# Patient Record
Sex: Female | Born: 1995 | Race: Black or African American | Hispanic: No | Marital: Single | Smoking: Never smoker
Health system: Southern US, Community
[De-identification: ages and names within clinical notes are randomized; demographics above are authoritative.]

## PROBLEM LIST (undated history)

## (undated) ENCOUNTER — Ambulatory Visit (HOSPITAL_COMMUNITY): Admission: EM | Payer: Self-pay

## (undated) DIAGNOSIS — M93003 Unspecified slipped upper femoral epiphysis (nontraumatic), unspecified hip: Secondary | ICD-10-CM

## (undated) DIAGNOSIS — M25562 Pain in left knee: Secondary | ICD-10-CM

## (undated) DIAGNOSIS — S060X0A Concussion without loss of consciousness, initial encounter: Principal | ICD-10-CM

## (undated) HISTORY — DX: Pain in left knee: M25.562

## (undated) HISTORY — PX: OTHER SURGICAL HISTORY: SHX169

## (undated) HISTORY — DX: Unspecified slipped upper femoral epiphysis (nontraumatic), unspecified hip: M93.003

## (undated) HISTORY — DX: Concussion without loss of consciousness, initial encounter: S06.0X0A

---
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
## 2019-10-16 ENCOUNTER — Encounter (HOSPITAL_COMMUNITY): Payer: Self-pay | Admitting: Emergency Medicine

## 2019-10-16 DIAGNOSIS — J020 Streptococcal pharyngitis: Secondary | ICD-10-CM | POA: Insufficient documentation

## 2019-10-16 LAB — GROUP A STREP BY PCR: Group A Strep by PCR: DETECTED — AB

## 2019-10-16 MED ORDER — ACETAMINOPHEN 325 MG PO TABS
650.0000 mg | ORAL_TABLET | Freq: Once | ORAL | Status: AC | PRN
  Administered 2019-10-16: 650 mg via ORAL
  Filled 2019-10-16: qty 2

## 2019-10-16 NOTE — ED Triage Notes (Signed)
Patient c/o sore throat x 2 days. 

## 2019-10-17 ENCOUNTER — Emergency Department (HOSPITAL_COMMUNITY)
Admission: EM | Admit: 2019-10-17 | Discharge: 2019-10-17 | Disposition: A | Discharge status: Home / Self Care | Payer: Self-pay | Attending: Emergency Medicine | Admitting: Emergency Medicine

## 2019-10-17 DIAGNOSIS — J020 Streptococcal pharyngitis: Secondary | ICD-10-CM

## 2019-10-17 MED ORDER — LIDOCAINE VISCOUS HCL 2 % MT SOLN: 15.0000 mL | OROMUCOSAL | 0 refills | Status: AC | PRN

## 2019-10-17 MED ORDER — LIDOCAINE VISCOUS HCL 2 % MT SOLN
15.0000 mL | Freq: Once | OROMUCOSAL | Status: AC
  Administered 2019-10-17: 15 mL via OROMUCOSAL
  Filled 2019-10-17: qty 15

## 2019-10-17 MED ORDER — PENICILLIN G BENZATHINE 1200000 UNIT/2ML IM SUSP
1.2000 10*6.[IU] | Freq: Once | INTRAMUSCULAR | Status: AC
  Administered 2019-10-17: 1.2 10*6.[IU] via INTRAMUSCULAR
  Filled 2019-10-17: qty 2

## 2019-10-17 NOTE — Discharge Instructions (Signed)
Your test for strep throat today was positive.  We have treated you today for this in full. You can continue the viscous lidocaine as needed for discomfort.  Popsicles, warm tea, etc. May help as well. Return here for new concerns.

## 2019-10-17 NOTE — ED Provider Notes (Signed)
[facility]-EMERGENCY DEPT Provider Note   CSN: [id number] Arrival date & time: 10/16/19  2038     History Chief Complaint  Patient presents with  . Sore Throat    Gabriella Klein is a Gabriella Klein.  The history is provided by the patient and medical records.  Sore Throat    Gabri[age] y.o. F presenting to the ED for sore throat x2 days.  States it is extremely painful when trying to eat and drink but she is able to swallow without difficulty.  She has had some low-grade fevers.  She denies any sick contacts or noted Covid exposures.  States she feels like there is "residue" on the back of her tongue.  Tylenol given in triage, no other intervention tried.  Past Medical History:  Diagnosis Date  . Concussion without loss of consciousness 01/06/2012   Kicked in head cheerleading  . Knee pain, left 08/2011   Referred to Dr.  Charlett Blake b/o hx of SCFE on other side   . 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 Viral pharyngitis 05/03/2015  . Tinea corporis 09/20/2014  . Epigastric pain 07/10/2014  . Other seasonal allergic rhinitis 01/04/2014  . Need for prophylactic vaccination and inoculation against influenza 01/04/2014  . Immunization due 01/04/2014  . External hemorrhoid 11/30/2013  . Irregular periods/menstrual cycles 11/30/2013  . Furuncle of ear canal 05/Gabriella/2015  . 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 Left hip pain 09/16/2012  . Concussion without loss of consciousness 01/06/2012  . 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 SCFE    . SLIPPED CAPITAL FEMORAL EPIPHYSIS PINNING  2011   left     OB History   No obstetric history on file.     No family history on file.  Social History   Tobacco Use  . Smoking status: Never Smoker  Substance Use Topics  . Alcohol use:  Not on file  . Drug use: Not on file    Home Medications Prior to Admission medications   Medication Sig Start Date End Date Taking? Authorizing Provider  dicyclomine (BENTYL) 20 MG tablet Take 1 tablet (20 mg total) by mouth 4 (four) times daily -  before meals and at bedtime. 04/09/14 04/12/14  Truddie Coco, DO  ibuprofen (ADVIL,MOTRIN) 600 MG tablet Take 1 tab PO Q6h x 1-2 days then Q6h prn 06/03/14   Lowanda Foster, NP    Allergies    Patient has no known allergies.  Review of Systems   Review of Systems  HENT: Positive for sore throat.   All other systems reviewed and are negative.   Physical Exam Updated Vital Signs BP (!) 105/59   Pulse 66   Temp (!) 100.4 F (38 C) (Oral)   Resp 18   Ht 5\' 8"  (1.727 m)   Wt 89.6 kg   LMP 10/16/2019   SpO2 99%   BMI 30.03 kg/m   Physical Exam Vitals and nursing note reviewed.  Constitutional:      Appearance: She is well-developed.  HENT:     Head: Normocephalic and atraumatic.     Comments: Tonsils 1+ bilaterally with exudates; uvula midline without evidence of peritonsillar abscess; handling secretions appropriately; no difficulty swallowing or speaking; normal phonation without stridor Eyes:     Conjunctiva/sclera: Conjunctivae normal.  Pupils: Pupils are equal, round, and reactive to light.  Cardiovascular:     Rate and Rhythm: Normal rate and regular rhythm.     Heart sounds: Normal heart sounds.  Pulmonary:     Effort: Pulmonary effort is normal.     Breath sounds: Normal breath sounds.  Abdominal:     General: Bowel sounds are normal.     Palpations: Abdomen is soft.  Musculoskeletal:        General: Normal range of motion.     Cervical back: Normal range of motion.  Skin:    General: Skin is warm and dry.  Neurological:     Mental Status: She is alert and oriented to person, place, and time.     ED Results / Procedures / Treatments   Labs (all labs ordered are listed, but only abnormal results are  displayed) Labs Reviewed  GROUP A STREP BY PCR - Abnormal; Notable for the following components:      Result Value   Group A Strep by PCR DETECTED (*)    All other components within normal limits    EKG None  Radiology No results found.  Procedures Procedures (including critical care time)  Medications Ordered in ED Medications  acetaminophen (TYLENOL) tablet 650 mg (650 mg Oral Given 10/16/19 2213)  penicillin g benzathine (BICILLIN LA) 1200000 UNIT/2ML injection 1.2 Million Units (1.2 Million Units Intramuscular Given 10/17/19 0146)  lidocaine (XYLOCAINE) 2 % viscous mouth solution 15 mL (15 mLs Mouth/Throat Given 10/17/19 0145)    ED Course  I have reviewed the triage vital signs and the nursing notes.  Pertinent labs & imaging results that were available during my care of the patient were reviewed by me and considered in my medical decision making (see chart for details).    MDM Rules/Calculators/A&P  Gabriella year old Klein presenting to the ED with 2 days of sore throat and low-grade fever.  Initially febrile here but nontoxic in appearance.  She does have some mild tonsillar edema, but this is symmetric bilaterally.  She does have exudates present.  She is handling secretions well, normal phonation without stridor.  Strep test is positive.  She opted for treatment here with Bicillin.  We will plan to discharge home with viscous lidocaine and symptomatic care.  Close follow-up with PCP.  Return here for any new or acute changes.  Final Clinical Impression(s) / ED Diagnoses Final diagnoses:  Strep pharyngitis    Rx / DC Orders ED Discharge Orders         Ordered    lidocaine (XYLOCAINE) 2 % solution  As needed     Discontinue  Reprint     10/17/19 0157           Larene Pickett, PA-C 10/17/19 0216    Fatima Blank, MD 10/21/19 0800

## 2020-09-21 ENCOUNTER — Other Ambulatory Visit: Payer: Self-pay

## 2021-07-22 ENCOUNTER — Inpatient Hospital Stay (HOSPITAL_COMMUNITY)
Admission: AD | Admit: 2021-07-22 | Discharge: 2021-07-22 | Disposition: A | Discharge status: Home / Self Care | Payer: Medicaid Other | Attending: Obstetrics and Gynecology | Admitting: Obstetrics and Gynecology

## 2021-07-22 ENCOUNTER — Encounter (HOSPITAL_COMMUNITY): Payer: Self-pay | Admitting: Obstetrics and Gynecology

## 2021-07-22 ENCOUNTER — Other Ambulatory Visit: Payer: Self-pay

## 2021-07-22 DIAGNOSIS — R52 Pain, unspecified: Secondary | ICD-10-CM | POA: Diagnosis not present

## 2021-07-22 DIAGNOSIS — Z3A22 22 weeks gestation of pregnancy: Secondary | ICD-10-CM | POA: Diagnosis not present

## 2021-07-22 DIAGNOSIS — R1013 Epigastric pain: Secondary | ICD-10-CM

## 2021-07-22 DIAGNOSIS — M546 Pain in thoracic spine: Secondary | ICD-10-CM | POA: Diagnosis present

## 2021-07-22 DIAGNOSIS — Z79899 Other long term (current) drug therapy: Secondary | ICD-10-CM | POA: Diagnosis not present

## 2021-07-22 DIAGNOSIS — M62830 Muscle spasm of back: Secondary | ICD-10-CM | POA: Diagnosis not present

## 2021-07-22 DIAGNOSIS — O99891 Other specified diseases and conditions complicating pregnancy: Secondary | ICD-10-CM | POA: Insufficient documentation

## 2021-07-22 MED ORDER — CYCLOBENZAPRINE HCL 5 MG PO TABS
10.0000 mg | ORAL_TABLET | Freq: Once | ORAL | Status: AC
  Administered 2021-07-22: 10 mg via ORAL
  Filled 2021-07-22: qty 2

## 2021-07-22 MED ORDER — CYCLOBENZAPRINE HCL 10 MG PO TABS: 10.0000 mg | ORAL_TABLET | Freq: Three times a day (TID) | ORAL | 0 refills | Status: AC | PRN

## 2021-07-22 NOTE — MAU Note (Signed)
Gabriella Klein is a 26 y.o. at Unknown here in MAU reporting: chest and back tightness and pain that starts in the center of her back between her shoulder blades, radiates to her chest. States this started 2 days ago.Pain is most intense when pt is lying down, moving side to side. Pain does not radiate to jaw or arms. Denies nausea or vomiting. Denies fever, illness, Pt denies cramping, contractions, SROM, vaginal bleeding or bloody show. Reports DFM today ? ?Onset of complaint: 07/20/2021 ?Pain score: 4 movement increases it to 8 ?There were no vitals filed for this visit.   ?FHT:155bpm ?Lab orders placed from triage:   ?

## 2021-07-22 NOTE — MAU Provider Note (Signed)
Chief Complaint:  Back Pain and Chest Pain ? ? Event Date/Time  ? First Provider Initiated Contact with Patient 07/22/21 0112   ?  ?HPI: Gabriella Klein is a 26 y.o. G1P0 at 56w5dwho presents to maternity admissions reporting pain in upper back, near rhomboids which causes pain when moving, swallowing and blowing nose.  That pain shoots through to sternum.  Started 2 days ago, gets more severe with movement. Denies acid reflux.  Has never had this pain before.  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 [name] ?She reports good fetal movement, denies h/a, n/v, or fever/chills.   ? ?Back Pain ?This is a new problem. The current episode started yesterday. The problem has been waxing and waning since onset. The pain is present in the thoracic spine. The quality of the pain is described as cramping. Radiates to: chest. Stiffness is present All day. Associated symptoms include chest pain. Pertinent negatives include no abdominal pain, fever, numbness, paresis, paresthesias or pelvic pain. She has tried nothing for the symptoms.  ?Chest Pain  ?This is a new problem. The current episode started yesterday. The pain is present in the substernal region. Associated symptoms include back pain. Pertinent negatives include no abdominal pain, fever or numbness. The pain is aggravated by movement. She has tried nothing for the symptoms.  ? ?RN Note: ?Gabriella Klein is a 26 y.o. at Unknown here in MAU reporting: chest and back tightness and pain that starts in the center of her back between her shoulder blades, radiates to her chest. States this started 2 days ago.Pain is most intense when pt is lying down, moving side to side. Pain does not radiate to jaw or arms. Denies nausea or vomiting. Denies fever, illness, Pt denies cramping, contractions, SROM, vaginal bleeding or bloody show. Reports DFM today  ?Onset of complaint: 07/20/2021 ?Pain score: 4 movement increases it to 8 ? ?Past Medical  History: ?Past Medical History:  ?Diagnosis Date  ? Concussion without loss of consciousness 01/06/2012  ? Kicked in head cheerleading  ? Knee pain, left 08/2011  ? Referred to Dr.  Charlett Blake b/o hx of SCFE on other side   ? SCFE (slipped capital femoral epiphysis) 2011  ? surgery, left side  ? ? ?Past obstetric history: ?OB History  ?Gravida Para Term Preterm AB Living  ?1            ?SAB IAB Ectopic Multiple Live Births  ?           ?  ?# Outcome Date GA Lbr Len/2nd Weight Sex Delivery Anes PTL Lv  ?1 Current           ? ? ?Past Surgical History: ?Past Surgical History:  ?Procedure Laterality Date  ? SCFE    ? SLIPPED CAPITAL FEMORAL EPIPHYSIS PINNING  2011  ? left  ? ? ?Family History: ?No family history on file. ? ?Social History: ?Social History  ? ?Tobacco Use  ? Smoking status: Never  ? ? ?Allergies: No Known Allergies ? ?Meds:  ?Medications Prior to Admission  ?Medication Sig Dispense Refill Last Dose  ? dicyclomine (BENTYL) 20 MG tablet Take 1 tablet (20 mg total) by mouth 4 (four) times daily -  before meals and at bedtime. 20 tablet 0   ? ibuprofen (ADVIL,MOTRIN) 600 MG tablet Take 1 tab PO Q6h x 1-2 days then Q6h prn 30 tablet 0   ? lidocaine (XYLOCAINE) 2 % solution Use as directed 15 mLs in the mouth  or throat as needed for mouth pain. 150 mL 0   ? ? ?I have reviewed patient's Past Medical Hx, Surgical Hx, Family Hx, Social Hx, medications and allergies.  ? ?ROS:  ?Review of Systems  ?Constitutional:  Negative for fever.  ?Cardiovascular:  Positive for chest pain.  ?Gastrointestinal:  Negative for abdominal pain.  ?Genitourinary:  Negative for pelvic pain.  ?Musculoskeletal:  Positive for back pain.  ?Neurological:  Negative for numbness and paresthesias.  ?Other systems negative ? ?Physical Exam  ?Patient Vitals for the past 24 hrs: ? BP Temp Temp src Pulse Resp SpO2 Height Weight  ?07/22/21 0039 118/64 98.2 ?F (36.8 ?C) Oral 94 18 99 % 5\' 9"  (1.753 m) 105.5 kg  ? ?Constitutional: Well-developed,  well-nourished female in no acute distress.  ?Cardiovascular: normal rate and rhythm ?Respiratory: normal effort, clear to auscultation bilaterally ?GI: Abd soft, non-tender, gravid appropriate for gestational age.   No rebound or guarding. ?MS: Extremities nontender, no edema, normal ROM ?Neurologic: Alert and oriented x 4.  ?GU: Neg CVAT. ? ?FHT:  155 ?  ?Labs: ?No results found for this or any previous visit (from the past 24 hour(s)). ?  ? ?Imaging:  ?EKG showed normal sinus rhythm ? ?MAU Course/MDM: ?I have ordered EKG and reviewed results.  This is normal  ?Oxygen saturation is normal  ?Dyspnea is not observed. No coughing.  ?Discussed possible etiologies.  Could involve acid reflux since she reports pain in "esophagus" when swallowing.  Pt states she has no acid reflux and does not think this is source of pain.  Declines GI cocktail ?Discussed this is more likely spasm in Thoracic muscles, rhomboid, trapezius, given pattern of pain and inciting factors.   Agrees to try Flexeril ?Treatments in MAU included Flexeril  ?Patient told RN that pain was very much improved, states when blowing nose it was significantly less painful ?When I asked, she states pain is only marginally better.  ?I offered to transfer her to ED for Ortho Eval, declines. ?Also told her that endoscopy may be required if it appears to be esophagitis, but this is less likely   Discussed muscular spasm pain does not improve immediately.  Sometimes it takes several days of therapy to see significant improvement. .   ? ?Assessment: ?Pregnancy at [redacted]w[redacted]d ?Thoracic back pain with radiation to chest, likely spasm ?Improvement reported to RN after treatment, denied much improvement to me.   ? ?Plan: ?Discharge home ?Rx Flexeril sent to pharmacy.  Recommend 24-48 hrs of treatment and rest  ?Follow up in Office for prenatal visits and recheck of status ?May go to [facility] walk-in clinic if desires Ortho eval ?Encouraged to return if she develops  worsening of symptoms, increase in pain, fever, or other concerning symptoms.  ? ?Pt stable at time of discharge. ? ?[redacted]w[redacted]d CNM, MSN ?Certified Nurse-Midwife ?07/22/2021 ?1:12 AM ?
